# Patient Record
Sex: Female | Born: 1995 | Race: White | Hispanic: No | Marital: Single | Smoking: Never smoker
Health system: Southern US, Community
[De-identification: ages and names within clinical notes are randomized; demographics above are authoritative.]

## PROBLEM LIST (undated history)

## (undated) DIAGNOSIS — T8859XA Other complications of anesthesia, initial encounter: Secondary | ICD-10-CM

## (undated) DIAGNOSIS — S83249A Other tear of medial meniscus, current injury, unspecified knee, initial encounter: Secondary | ICD-10-CM

## (undated) DIAGNOSIS — J3089 Other allergic rhinitis: Secondary | ICD-10-CM

## (undated) DIAGNOSIS — T4145XA Adverse effect of unspecified anesthetic, initial encounter: Secondary | ICD-10-CM

## (undated) DIAGNOSIS — S83519A Sprain of anterior cruciate ligament of unspecified knee, initial encounter: Secondary | ICD-10-CM

## (undated) DIAGNOSIS — J3081 Allergic rhinitis due to animal (cat) (dog) hair and dander: Secondary | ICD-10-CM

## (undated) DIAGNOSIS — L709 Acne, unspecified: Secondary | ICD-10-CM

## (undated) HISTORY — PX: TONSILLECTOMY: SUR1361

## (undated) HISTORY — PX: ADENOIDECTOMY: SUR15

---
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
## 2016-05-25 ENCOUNTER — Encounter (HOSPITAL_BASED_OUTPATIENT_CLINIC_OR_DEPARTMENT_OTHER): Payer: Self-pay | Admitting: *Deleted

## 2016-06-01 DIAGNOSIS — T8489XA Other specified complication of internal orthopedic prosthetic devices, implants and grafts, initial encounter: Secondary | ICD-10-CM | POA: Diagnosis present

## 2016-06-01 NOTE — H&P (Signed)
Kerri Durham is an 20 y.o. female.   Chief Complaint: left knee pain and instablility [id number]:Kerri Durham is a 20 year old seen for follow-up evaluation from an injury to her left knee that occurred playing college basketball 2-1/2 months ago.  She had undergone an MRI that revealed a tear of her hamstring autograft with a medial meniscus tear.  She had previously undergone hamstring autograft ACL reconstruction by Dr. Dion SaucierLandau two years ago and had also undergone right knee hamstring autograft reconstruction four years ago by Dr. 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    Past Medical History:  Diagnosis Date  . ACL tear 03/2014   left  . Acne    takes Aldactone  . Allergy to cats   . Complication of anesthesia    residual numbness right thigh and shin area - "nerve block or tourniquet", per mother  . Dust allergy   . Medial meniscus tear 03/2014   left  . Tears of medial meniscus and ACL of left knee 03/23/2014    Past Surgical History:  Procedure Laterality Date  . KNEE ARTHROSCOPY WITH ANTERIOR CRUCIATE LIGAMENT (ACL) REPAIR Left 03/23/2014   Procedure: LEFT KNEE ARTHROSCOPY WITH MENISCUS DEBRIDMENT  MEDIAL, ANTERIOR CRUCIATE LIGAMENT (ACL) REPAIR;  Surgeon: Eulas PostJoshua P Landau, MD;  Location: [facility];  Service: Orthopedics;  Laterality: Left;  and meniscal debridment  . KNEE ARTHROSCOPY WITH ANTERIOR CRUCIATE LIGAMENT (ACL) REPAIR WITH HAMSTRING GRAFT  08/12/2012   Procedure: KNEE ARTHROSCOPY WITH ANTERIOR CRUCIATE LIGAMENT (ACL) REPAIR WITH HAMSTRING GRAFT;  Surgeon: Eulas PostJoshua P Landau, MD;  Location: [facility];  Service: Orthopedics;  Laterality: Right;  Partial Lateral Menisectomy also  . TONSILLECTOMY  age 89    Family History  Problem Relation Age of Onset   . Hypertension Maternal Grandmother   . Heart disease Maternal Grandmother   . Stroke Maternal Grandmother   . Hypertension Maternal Grandfather   . Diabetes Paternal Grandmother   . Hypertension Paternal Grandmother   . COPD Paternal Grandmother    Social History:  reports that she has never smoked. She has never used smokeless tobacco. She reports that she does not drink alcohol or use drugs.  Allergies: No Known Allergies  No current facility-administered medications for this encounter.   Current Outpatient Prescriptions:  .  zinc gluconate 50 MG tablet, Take 50 mg by mouth daily., Disp: , Rfl:  .  Ascorbic Acid (VITAMIN C) 1000 MG tablet, Take 1,000 mg by mouth daily., Disp: , Rfl:  .  calcium carbonate (OS-CAL) 600 MG TABS, Take 600 mg by mouth 2 (two) times daily with a meal., Disp: , Rfl:  .  cholecalciferol (VITAMIN D) 1000 UNITS tablet, Take 1,000 Units by mouth daily., Disp: , Rfl:  .  loratadine (CLARITIN) 10 MG tablet, Take 10 mg by mouth as needed. , Disp: , Rfl:  .  Multiple Vitamin (MULTIVITAMIN) tablet, Take 1 tablet by mouth daily., Disp: , Rfl:  .  vitamin B-12 (CYANOCOBALAMIN) 100 MCG tablet, Take 100 mcg by mouth daily., Disp: , Rfl:   No results found for this or any previous visit (from the past 48 hour(s)). No results found.  Review of Systems  Constitutional: Negative.   Eyes: Negative.   Cardiovascular: Negative.  Gastrointestinal: Negative.   Musculoskeletal: Positive for joint pain.  Skin: Negative.   Neurological: Negative.   Endo/Heme/Allergies: Negative.   Psychiatric/Behavioral: Negative.     Height 5\' 9"  (1.753 m), weight 71.7 kg (158 lb), last menstrual period 05/06/2016. Physical Exam  Constitutional: She is oriented to person, place, and time. She appears well-developed and well-nourished.  HENT:  Head: Normocephalic and atraumatic.  Eyes: Conjunctivae are normal. Pupils are equal, round, and reactive to light.  Cardiovascular: Normal  rate.   Respiratory: Effort normal.  GI: Soft.  Genitourinary:  Genitourinary Comments: Not pertinent to current symptomatology therefore not examined.  Musculoskeletal:  Left knee reveals previous incisions well healed.  No swelling, mild pain.  Full range of motion.  2-3+ Lachman.  Knee otherwise stable with positive medial McMurray's.  Examination of her right knee reveals previous well-healed ACL incisions.  No swelling, no pain.  Full range of motion.  Knee is stable with normal patellar tracking.  Vascular exam:  Pulses 2+ and symmetric.    Neurological: She is alert and oriented to person, place, and time.  Skin: Skin is warm and dry.  Psychiatric: She has a normal mood and affect. Her behavior is normal.     Assessment Principal Problem:   ACL graft tear (HCC) left knee Active Problems:   Tears of medial meniscus and ACL of left knee   Plan Left knee arthroscopy with partial medial meniscectomy and ACL revision using an allograft  Araf Clugston J, PA-C 06/01/2016, 4:43 PM

## 2016-06-02 ENCOUNTER — Encounter (HOSPITAL_BASED_OUTPATIENT_CLINIC_OR_DEPARTMENT_OTHER): Payer: Self-pay | Admitting: *Deleted

## 2016-06-05 ENCOUNTER — Encounter (HOSPITAL_BASED_OUTPATIENT_CLINIC_OR_DEPARTMENT_OTHER): Payer: Self-pay | Admitting: *Deleted

## 2016-06-05 ENCOUNTER — Ambulatory Visit (HOSPITAL_BASED_OUTPATIENT_CLINIC_OR_DEPARTMENT_OTHER): Payer: BLUE CROSS/BLUE SHIELD | Admitting: Anesthesiology

## 2016-06-05 ENCOUNTER — Encounter (HOSPITAL_BASED_OUTPATIENT_CLINIC_OR_DEPARTMENT_OTHER)
Admission: RE | Disposition: A | Discharge status: Home / Self Care | Payer: Self-pay | Source: Ambulatory Visit | Attending: Orthopedic Surgery

## 2016-06-05 ENCOUNTER — Ambulatory Visit (HOSPITAL_BASED_OUTPATIENT_CLINIC_OR_DEPARTMENT_OTHER)
Admission: RE | Admit: 2016-06-05 | Discharge: 2016-06-05 | Disposition: A | Discharge status: Home / Self Care | Payer: BLUE CROSS/BLUE SHIELD | Source: Ambulatory Visit | Attending: Orthopedic Surgery | Admitting: Orthopedic Surgery

## 2016-06-05 DIAGNOSIS — Y832 Surgical operation with anastomosis, bypass or graft as the cause of abnormal reaction of the patient, or of later complication, without mention of misadventure at the time of the procedure: Secondary | ICD-10-CM | POA: Diagnosis not present

## 2016-06-05 DIAGNOSIS — S83511A Sprain of anterior cruciate ligament of right knee, initial encounter: Secondary | ICD-10-CM | POA: Diagnosis present

## 2016-06-05 DIAGNOSIS — T85898A Other specified complication of other internal prosthetic devices, implants and grafts, initial encounter: Secondary | ICD-10-CM | POA: Diagnosis not present

## 2016-06-05 DIAGNOSIS — S83271A Complex tear of lateral meniscus, current injury, right knee, initial encounter: Secondary | ICD-10-CM | POA: Diagnosis present

## 2016-06-05 DIAGNOSIS — X58XXXA Exposure to other specified factors, initial encounter: Secondary | ICD-10-CM | POA: Diagnosis not present

## 2016-06-05 DIAGNOSIS — S83282A Other tear of lateral meniscus, current injury, left knee, initial encounter: Secondary | ICD-10-CM | POA: Diagnosis present

## 2016-06-05 DIAGNOSIS — M94262 Chondromalacia, left knee: Secondary | ICD-10-CM | POA: Diagnosis not present

## 2016-06-05 DIAGNOSIS — T8489XA Other specified complication of internal orthopedic prosthetic devices, implants and grafts, initial encounter: Secondary | ICD-10-CM | POA: Diagnosis present

## 2016-06-05 DIAGNOSIS — S83512A Sprain of anterior cruciate ligament of left knee, initial encounter: Secondary | ICD-10-CM

## 2016-06-05 DIAGNOSIS — S83207A Unspecified tear of unspecified meniscus, current injury, left knee, initial encounter: Secondary | ICD-10-CM | POA: Diagnosis present

## 2016-06-05 HISTORY — PX: KNEE ARTHROSCOPY WITH LATERAL MENISECTOMY: SHX6193

## 2016-06-05 SURGERY — ARTHROSCOPY, KNEE, WITH LATERAL MENISCECTOMY
Anesthesia: Regional | Site: Knee | Laterality: Left

## 2016-06-05 MED ORDER — MIDAZOLAM HCL 2 MG/2ML IJ SOLN
INTRAMUSCULAR | Status: AC
  Filled 2016-06-05: qty 2

## 2016-06-05 MED ORDER — MIDAZOLAM HCL 2 MG/2ML IJ SOLN
1.0000 mg | INTRAMUSCULAR | Status: DC | PRN
  Administered 2016-06-05: 1 mg via INTRAVENOUS
  Administered 2016-06-05: 2 mg via INTRAVENOUS

## 2016-06-05 MED ORDER — HYDROMORPHONE HCL 1 MG/ML IJ SOLN: 0.2500 mg | INTRAMUSCULAR | Status: DC | PRN

## 2016-06-05 MED ORDER — BUPIVACAINE-EPINEPHRINE (PF) 0.5% -1:200000 IJ SOLN
INTRAMUSCULAR | Status: DC | PRN
  Administered 2016-06-05: 30 mL via PERINEURAL

## 2016-06-05 MED ORDER — HYDROCODONE-ACETAMINOPHEN 5-325 MG PO TABS: ORAL_TABLET | ORAL | 0 refills | Status: DC

## 2016-06-05 MED ORDER — LACTATED RINGERS IV SOLN
INTRAVENOUS | Status: DC
  Administered 2016-06-05 (×2): via INTRAVENOUS

## 2016-06-05 MED ORDER — FENTANYL CITRATE (PF) 100 MCG/2ML IJ SOLN
50.0000 ug | INTRAMUSCULAR | Status: AC | PRN
  Administered 2016-06-05: 50 ug via INTRAVENOUS
  Administered 2016-06-05: 100 ug via INTRAVENOUS
  Administered 2016-06-05: 50 ug via INTRAVENOUS

## 2016-06-05 MED ORDER — CHLORHEXIDINE GLUCONATE 4 % EX LIQD: 60.0000 mL | Freq: Once | CUTANEOUS | Status: DC

## 2016-06-05 MED ORDER — BUPIVACAINE-EPINEPHRINE 0.25% -1:200000 IJ SOLN
INTRAMUSCULAR | Status: DC | PRN
  Administered 2016-06-05: 20 mL

## 2016-06-05 MED ORDER — LIDOCAINE 2% (20 MG/ML) 5 ML SYRINGE
INTRAMUSCULAR | Status: AC
  Filled 2016-06-05: qty 5

## 2016-06-05 MED ORDER — PROPOFOL 10 MG/ML IV BOLUS
INTRAVENOUS | Status: DC | PRN
  Administered 2016-06-05: 150 mg via INTRAVENOUS

## 2016-06-05 MED ORDER — ONDANSETRON HCL 4 MG/2ML IJ SOLN
INTRAMUSCULAR | Status: AC
  Filled 2016-06-05: qty 2

## 2016-06-05 MED ORDER — FENTANYL CITRATE (PF) 100 MCG/2ML IJ SOLN
INTRAMUSCULAR | Status: AC
  Filled 2016-06-05: qty 2

## 2016-06-05 MED ORDER — POVIDONE-IODINE 7.5 % EX SOLN: Freq: Once | CUTANEOUS | Status: DC

## 2016-06-05 MED ORDER — ONDANSETRON HCL 4 MG/2ML IJ SOLN
INTRAMUSCULAR | Status: DC | PRN
  Administered 2016-06-05: 4 mg via INTRAVENOUS

## 2016-06-05 MED ORDER — ACETAMINOPHEN 500 MG PO TABS
ORAL_TABLET | ORAL | Status: AC
  Filled 2016-06-05: qty 2

## 2016-06-05 MED ORDER — BUPIVACAINE-EPINEPHRINE (PF) 0.25% -1:200000 IJ SOLN
INTRAMUSCULAR | Status: AC
  Filled 2016-06-05: qty 30

## 2016-06-05 MED ORDER — CEFAZOLIN SODIUM-DEXTROSE 2-4 GM/100ML-% IV SOLN
INTRAVENOUS | Status: AC
  Filled 2016-06-05: qty 100

## 2016-06-05 MED ORDER — GLYCOPYRROLATE 0.2 MG/ML IJ SOLN: 0.2000 mg | Freq: Once | INTRAMUSCULAR | Status: DC | PRN

## 2016-06-05 MED ORDER — SCOPOLAMINE 1 MG/3DAYS TD PT72: 1.0000 | MEDICATED_PATCH | Freq: Once | TRANSDERMAL | Status: DC | PRN

## 2016-06-05 MED ORDER — PROPOFOL 500 MG/50ML IV EMUL
INTRAVENOUS | Status: AC
  Filled 2016-06-05: qty 50

## 2016-06-05 MED ORDER — ACETAMINOPHEN 160 MG/5ML PO SOLN: 960.0000 mg | Freq: Once | ORAL | Status: AC

## 2016-06-05 MED ORDER — LIDOCAINE 2% (20 MG/ML) 5 ML SYRINGE
INTRAMUSCULAR | Status: DC | PRN
  Administered 2016-06-05: 40 mg via INTRAVENOUS

## 2016-06-05 MED ORDER — DEXAMETHASONE SODIUM PHOSPHATE 10 MG/ML IJ SOLN
INTRAMUSCULAR | Status: DC | PRN
  Administered 2016-06-05: 10 mg via INTRAVENOUS

## 2016-06-05 MED ORDER — ATROPINE SULFATE 0.4 MG/ML IJ SOLN
INTRAMUSCULAR | Status: AC
  Filled 2016-06-05: qty 1

## 2016-06-05 MED ORDER — DEXAMETHASONE SODIUM PHOSPHATE 10 MG/ML IJ SOLN
INTRAMUSCULAR | Status: AC
Start: 2016-06-05 — End: 2016-06-05
  Filled 2016-06-05: qty 1

## 2016-06-05 MED ORDER — EPINEPHRINE HCL 1 MG/ML IJ SOLN
INTRAMUSCULAR | Status: DC | PRN
  Administered 2016-06-05: 2000 mL

## 2016-06-05 MED ORDER — EPINEPHRINE HCL 1 MG/ML IJ SOLN
INTRAMUSCULAR | Status: AC
Start: 2016-06-05 — End: 2016-06-05
  Filled 2016-06-05: qty 1

## 2016-06-05 MED ORDER — ACETAMINOPHEN 500 MG PO TABS
1000.0000 mg | ORAL_TABLET | Freq: Once | ORAL | Status: AC
  Administered 2016-06-05: 1000 mg via ORAL

## 2016-06-05 MED ORDER — CEFAZOLIN SODIUM-DEXTROSE 2-4 GM/100ML-% IV SOLN
2.0000 g | INTRAVENOUS | Status: AC
  Administered 2016-06-05: 2 g via INTRAVENOUS

## 2016-06-05 MED ORDER — LACTATED RINGERS IV SOLN: INTRAVENOUS | Status: DC

## 2016-06-05 SURGICAL SUPPLY — 95 items
APL SKNCLS STERI-STRIP NONHPOA (GAUZE/BANDAGES/DRESSINGS) ×2
BANDAGE ACE 4X5 VEL STRL LF (GAUZE/BANDAGES/DRESSINGS) ×4 IMPLANT
BENZOIN TINCTURE PRP APPL 2/3 (GAUZE/BANDAGES/DRESSINGS) ×4 IMPLANT
BLADE CUDA GRT WHITE 3.5 (BLADE) ×4 IMPLANT
BLADE CUTTER GATOR 3.5 (BLADE) ×4 IMPLANT
BLADE HEX COATED 2.75 (ELECTRODE) ×3 IMPLANT
BLADE SURG 15 STRL LF DISP TIS (BLADE) ×2 IMPLANT
BLADE SURG 15 STRL SS (BLADE) ×4
BUR OVAL 4.0 (BURR) ×3 IMPLANT
BUR OVAL 6.0 (BURR) ×4 IMPLANT
CLOSURE WOUND 1/2 X4 (GAUZE/BANDAGES/DRESSINGS) ×1
COVER BACK TABLE 60X90IN (DRAPES) ×4 IMPLANT
CUFF TOURNIQUET SINGLE 34IN LL (TOURNIQUET CUFF) ×3 IMPLANT
CUTTER FLIP II 9.5MM (INSTRUMENTS) IMPLANT
DECANTER SPIKE VIAL GLASS SM (MISCELLANEOUS) IMPLANT
DRAPE ARTHROSCOPY W/POUCH 90 (DRAPES) ×4 IMPLANT
DRAPE OEC MINIVIEW 54X84 (DRAPES) ×4 IMPLANT
DRAPE U-SHAPE 47X51 STRL (DRAPES) ×4 IMPLANT
DRAPE U-SHAPE 76X120 STRL (DRAPES) ×4 IMPLANT
DRILL FLIPCUTTER II 10.5MM (CUTTER) IMPLANT
DRILL FLIPCUTTER II 10MM (CUTTER) IMPLANT
DRILL FLIPCUTTER II 7.0MM (INSTRUMENTS) IMPLANT
DRILL FLIPCUTTER II 7.5MM (MISCELLANEOUS) IMPLANT
DRILL FLIPCUTTER II 8.0MM (INSTRUMENTS) IMPLANT
DRILL FLIPCUTTER II 8.5MM (INSTRUMENTS) IMPLANT
DRILL FLIPCUTTER II 9.0MM (INSTRUMENTS) IMPLANT
DRSG PAD ABDOMINAL 8X10 ST (GAUZE/BANDAGES/DRESSINGS) IMPLANT
DURAPREP 26ML APPLICATOR (WOUND CARE) ×4 IMPLANT
ELECT REM PT RETURN 9FT ADLT (ELECTROSURGICAL) ×4
ELECTRODE REM PT RTRN 9FT ADLT (ELECTROSURGICAL) ×2 IMPLANT
FLIP CUTTER II 7.0MM (INSTRUMENTS)
FLIPCUTTER II 10.5MM (CUTTER)
FLIPCUTTER II 10MM (CUTTER)
FLIPCUTTER II 7.5MM (MISCELLANEOUS)
FLIPCUTTER II 8.0MM (INSTRUMENTS)
FLIPCUTTER II 8.5MM (INSTRUMENTS)
FLIPCUTTER II 9.0MM (INSTRUMENTS)
GAUZE SPONGE 4X4 12PLY STRL (GAUZE/BANDAGES/DRESSINGS) ×4 IMPLANT
GAUZE XEROFORM 1X8 LF (GAUZE/BANDAGES/DRESSINGS) ×4 IMPLANT
GLOVE BIO SURGEON STRL SZ7 (GLOVE) ×8 IMPLANT
GLOVE BIOGEL M STRL SZ7.5 (GLOVE) ×3 IMPLANT
GLOVE BIOGEL PI IND STRL 7.0 (GLOVE) ×4 IMPLANT
GLOVE BIOGEL PI IND STRL 7.5 (GLOVE) ×2 IMPLANT
GLOVE BIOGEL PI IND STRL 8 (GLOVE) ×1 IMPLANT
GLOVE BIOGEL PI INDICATOR 7.0 (GLOVE) ×4
GLOVE BIOGEL PI INDICATOR 7.5 (GLOVE) ×2
GLOVE BIOGEL PI INDICATOR 8 (GLOVE) ×2
GLOVE SS BIOGEL STRL SZ 7.5 (GLOVE) ×2 IMPLANT
GLOVE SUPERSENSE BIOGEL SZ 7.5 (GLOVE) ×2
GOWN STRL REUS W/ TWL LRG LVL3 (GOWN DISPOSABLE) ×5 IMPLANT
GOWN STRL REUS W/ TWL XL LVL3 (GOWN DISPOSABLE) ×1 IMPLANT
GOWN STRL REUS W/TWL LRG LVL3 (GOWN DISPOSABLE) ×8
GOWN STRL REUS W/TWL XL LVL3 (GOWN DISPOSABLE) ×4
GUIDEPIN REAMER CUTTER 11MM (INSTRUMENTS) IMPLANT
IMMOBILIZER KNEE 22 UNIV (SOFTGOODS) IMPLANT
IMMOBILIZER KNEE 24 THIGH 36 (MISCELLANEOUS) IMPLANT
IMMOBILIZER KNEE 24 UNIV (MISCELLANEOUS)
IV NS IRRIG 3000ML ARTHROMATIC (IV SOLUTION) ×6 IMPLANT
KNEE WRAP E Z 3 GEL PACK (MISCELLANEOUS) ×4 IMPLANT
LOOP 2 FIBERLINK CLOSED (SUTURE) IMPLANT
MANIFOLD NEPTUNE II (INSTRUMENTS) ×4 IMPLANT
MARKER SKIN DUAL TIP RULER LAB (MISCELLANEOUS) ×1 IMPLANT
NDL SAFETY ECLIPSE 18X1.5 (NEEDLE) ×4 IMPLANT
NEEDLE HYPO 18GX1.5 SHARP (NEEDLE) ×8
NEEDLE HYPO 22GX1.5 SAFETY (NEEDLE) ×3 IMPLANT
PACK ARTHROSCOPY DSU (CUSTOM PROCEDURE TRAY) ×4 IMPLANT
PACK BASIN DAY SURGERY FS (CUSTOM PROCEDURE TRAY) ×4 IMPLANT
PAD CAST 4YDX4 CTTN HI CHSV (CAST SUPPLIES) IMPLANT
PADDING CAST COTTON 4X4 STRL (CAST SUPPLIES)
PENCIL BUTTON HOLSTER BLD 10FT (ELECTRODE) ×3 IMPLANT
PIN DRILL ACL TIGHTROPE 4MM (PIN) IMPLANT
SET ARTHROSCOPY TUBING (MISCELLANEOUS) ×4
SET ARTHROSCOPY TUBING LN (MISCELLANEOUS) ×2 IMPLANT
SLEEVE SCD COMPRESS KNEE MED (MISCELLANEOUS) ×4 IMPLANT
SPONGE LAP 4X18 X RAY DECT (DISPOSABLE) ×4 IMPLANT
STOCKING TED THIGH LEN LRG REG (STOCKING)
STOCKING TED THIGH LEN MED REG (STOCKING)
STOCKING THIGH LG REG (STOCKING) IMPLANT
STOCKING THIGH MED REG (STOCKING) IMPLANT
STRIP CLOSURE SKIN 1/2X4 (GAUZE/BANDAGES/DRESSINGS) ×3 IMPLANT
SUCTION FRAZIER HANDLE 10FR (MISCELLANEOUS) ×2
SUCTION TUBE FRAZIER 10FR DISP (MISCELLANEOUS) ×2 IMPLANT
SUT 2 FIBERLOOP 20 STRT BLUE (SUTURE) ×4
SUT ETHILON 4 0 PS 2 18 (SUTURE) IMPLANT
SUT FIBERWIRE #2 38 T-5 BLUE (SUTURE)
SUT PROLENE 3 0 PS 2 (SUTURE) ×4 IMPLANT
SUT VIC AB 0 CT3 27 (SUTURE) ×5 IMPLANT
SUT VIC AB 3-0 SH 27 (SUTURE) ×4
SUT VIC AB 3-0 SH 27X BRD (SUTURE) ×2 IMPLANT
SUTURE 2 FIBERLOOP 20 STRT BLU (SUTURE) ×1 IMPLANT
SUTURE FIBERWR #2 38 T-5 BLUE (SUTURE) IMPLANT
SYR 20CC LL (SYRINGE) IMPLANT
SYR 5ML LL (SYRINGE) ×4 IMPLANT
WAND STAR VAC 90 (SURGICAL WAND) IMPLANT
WATER STERILE IRR 1000ML POUR (IV SOLUTION) ×4 IMPLANT

## 2016-06-05 NOTE — Discharge Instructions (Signed)
Knee arthroscopy Care After Refer to this sheet in the next few weeks. These discharge instructions provide you with general information on caring for yourself after you leave the hospital. Your caregiver may also give you specific instructions. Your treatment has been planned according to the most current medical practices available, but unavoidable complications sometimes occur. If you have any problems or questions after discharge, please call your caregiver. HOME INSTRUCTIONS You may resume a normal diet and activities as directed. Perform exercises as directed.  Take showers instead of baths until informed otherwise.  Change bandages (dressings) in 3 days.  Swab wounds daily with betadine.  Wash leg with soap and water.  Pat dry.  Cover wounds with bandaids. Only take over-the-counter or prescription medicines for pain, discomfort, or fever as directed by your caregiver.  Eat a well-balanced diet.  Avoid lifting or driving until you are instructed otherwise.  Make an appointment to see your caregiver for stitches (suture) or staple removal as directed.    Post Anesthesia Home Care Instructions  Activity: Get plenty of rest for the remainder of the day. A responsible adult should stay with you for 24 hours following the procedure.  For the next 24 hours, DO NOT: -Drive a car -Advertising copywriterperate machinery -Drink alcoholic beverages -Take any medication unless instructed by your physician -Make any legal decisions or sign important papers.  Meals: Start with liquid foods such as gelatin or soup. Progress to regular foods as tolerated. Avoid greasy, spicy, heavy foods. If nausea and/or vomiting occur, drink only clear liquids until the nausea and/or vomiting subsides. Call your physician if vomiting continues.  Special Instructions/Symptoms: Your throat may feel dry or sore from the anesthesia or the breathing tube placed in your throat during surgery. If this causes discomfort, gargle with warm  salt water. The discomfort should disappear within 24 hours.  If you had a scopolamine patch placed behind your ear for the management of post- operative nausea and/or vomiting:  1. The medication in the patch is effective for 72 hours, after which it should be removed.  Wrap patch in a tissue and discard in the trash. Wash hands thoroughly with soap and water. 2. You may remove the patch earlier than 72 hours if you experience unpleasant side effects which may include dry mouth, dizziness or visual disturbances. 3. Avoid touching the patch. Wash your hands with soap and water after contact with the patch.   Regional Anesthesia Blocks  1. Numbness or the inability to move the "blocked" extremity may last from 3-48 hours after placement. The length of time depends on the medication injected and your individual response to the medication. If the numbness is not going away after 48 hours, call your surgeon.  2. The extremity that is blocked will need to be protected until the numbness is gone and the  Strength has returned. Because you cannot feel it, you will need to take extra care to avoid injury. Because it may be weak, you may have difficulty moving it or using it. You may not know what position it is in without looking at it while the block is in effect.  3. For blocks in the legs and feet, returning to weight bearing and walking needs to be done carefully. You will need to wait until the numbness is entirely gone and the strength has returned. You should be able to move your leg and foot normally before you try and bear weight or walk. You will need someone to be with  you when you first try to ensure you do not fall and possibly risk injury.  4. Bruising and tenderness at the needle site are common side effects and will resolve in a few days.  5. Persistent numbness or new problems with movement should be communicated to the surgeon or the [facility] [phone] [facility] [phone]).  SEEK MEDICAL CARE IF: You have swelling of your calf or leg.  You develop shortness of breath or chest pain.  You have redness, swelling, or increasing pain in the wound.  There is pus or any unusual drainage coming from the surgical site.  You notice a bad smell coming from the surgical site or dressing.  The surgical site breaks open after sutures or staples have been removed.  There is persistent bleeding from the suture or staple line.  You are getting worse or are not improving.  You have any other questions or concerns.  SEEK IMMEDIATE MEDICAL CARE IF:  You have a fever.  You develop a rash.  You have difficulty breathing.  You develop any reaction or side effects to medicines given.  Your knee motion is decreasing rather than improving.  MAKE SURE YOU:  Understand these instructions.  Will watch your condition.  Will get help right away if you are not doing well or get worse.

## 2016-06-05 NOTE — Anesthesia Procedure Notes (Addendum)
Anesthesia Regional Block:  Adductor canal block  Pre-Anesthetic Checklist: ,, timeout performed, Correct Patient, Correct Site, Correct Laterality, Correct Procedure, Correct Position, site marked, Risks and benefits discussed, pre-op evaluation,  At surgeon's request and post-op pain management  Laterality: Left  Prep: Maximum Sterile Barrier Precautions used, chloraprep       Needles:  Injection technique: Single-shot  Needle Type: Echogenic Stimulator Needle     Needle Length: 9cm 9 cm Needle Gauge: 21 and 21 G    Additional Needles:  Procedures: ultrasound guided (picture in chart) Adductor canal block Narrative:  Start time: 06/05/2016 7:12 AM End time: 06/05/2016 7:22 AM Injection made incrementally with aspirations every 5 mL. Anesthesiologist: Gaynelle AduFITZGERALD, Farrie Sann  Additional Notes: 2% Lidocaine skin wheel.

## 2016-06-05 NOTE — Anesthesia Postprocedure Evaluation (Signed)
Anesthesia Post Note  Patient: Chia A Veiga  Procedure(s) Performed: Procedure(s) (LRB): Left knee arthroscopy with partial medial meniscectomy (Left)  Patient location during evaluation: PACU Anesthesia Type: General and Regional Level of consciousness: awake and alert Pain management: pain level controlled Vital Signs Assessment: post-procedure vital signs reviewed and stable Respiratory status: spontaneous breathing, nonlabored ventilation and respiratory function stable Cardiovascular status: blood pressure returned to baseline and stable Postop Assessment: no signs of nausea or vomiting Anesthetic complications: no    Last Vitals:  Vitals:   06/05/16 0945 06/05/16 1000  BP: 102/66 101/65  Pulse: (!) 47 (!) 48  Resp: 11 12  Temp:      Last Pain:  Vitals:   06/05/16 0703  TempSrc: Oral                 Nerissa Constantin,W. EDMOND

## 2016-06-05 NOTE — Anesthesia Procedure Notes (Signed)
Procedure Name: LMA Insertion Date/Time: 06/05/2016 8:04 AM Performed by: Curly ShoresRAFT, Haeley Fordham W Pre-anesthesia Checklist: Patient identified, Emergency Drugs available, Suction available and Patient being monitored Patient Re-evaluated:Patient Re-evaluated prior to inductionOxygen Delivery Method: Circle system utilized Preoxygenation: Pre-oxygenation with 100% oxygen Intubation Type: IV induction Ventilation: Mask ventilation without difficulty LMA: LMA inserted LMA Size: 4.0 Number of attempts: 1 Airway Equipment and Method: Bite block Placement Confirmation: positive ETCO2 and breath sounds checked- equal and bilateral Tube secured with: Tape Dental Injury: Teeth and Oropharynx as per pre-operative assessment

## 2016-06-05 NOTE — Interval H&P Note (Signed)
History and Physical Interval Note:  06/05/2016 7:51 AM  Kerri Durham  has presented today for surgery, with the diagnosis of LEFT KNEE ARTHROSCOPY SPONTANEOUS DISRUPTION OF ANTERIOR CRUCIATE LIGAMENT,TEAR OF MEDIAL MENISCUS  The various methods of treatment have been discussed with the patient and family. After consideration of risks, benefits and other options for treatment, the patient has consented to  Procedure(s): LEFT KNEE ARTHROSCOPY MEDIAL MENISCECTOMY,ANTERIOR CRUCIATE LIGAMENT HAMSTRING ALLOGRAFT (Left) as a surgical intervention .  The patient's history has been reviewed, patient examined, no change in status, stable for surgery.  I have reviewed the patient's chart and labs.  Questions were answered to the patient's satisfaction.     Salvatore MarvelWAINER,Joseantonio Dittmar A

## 2016-06-05 NOTE — Anesthesia Preprocedure Evaluation (Addendum)
Anesthesia Evaluation  Patient identified by MRN, date of birth, ID band Patient awake    Reviewed: Allergy & Precautions, H&P , NPO status , Patient's Chart, lab work & pertinent test results  Airway Mallampati: I  TM Distance: >3 FB Neck ROM: Full    Dental no notable dental hx. (+) Teeth Intact, Dental Advisory Given   Pulmonary neg pulmonary ROS,    Pulmonary exam normal breath sounds clear to auscultation       Cardiovascular negative cardio ROS   Rhythm:Regular Rate:Normal     Neuro/Psych negative neurological ROS  negative psych ROS   GI/Hepatic negative GI ROS, Neg liver ROS,   Endo/Other  negative endocrine ROS  Renal/GU negative Renal ROS  negative genitourinary   Musculoskeletal   Abdominal   Peds  Hematology negative hematology ROS (+)   Anesthesia Other Findings   Reproductive/Obstetrics negative OB ROS                            Anesthesia Physical Anesthesia Plan  ASA: I  Anesthesia Plan: General and Regional   Post-op Pain Management: GA combined w/ Regional for post-op pain   Induction: Intravenous  Airway Management Planned: LMA  Additional Equipment:   Intra-op Plan:   Post-operative Plan: Extubation in OR  Informed Consent: I have reviewed the patients History and Physical, chart, labs and discussed the procedure including the risks, benefits and alternatives for the proposed anesthesia with the patient or authorized representative who has indicated his/her understanding and acceptance.   Dental advisory given  Plan Discussed with: CRNA  Anesthesia Plan Comments:         Anesthesia Quick Evaluation  

## 2016-06-05 NOTE — Transfer of Care (Signed)
Immediate Anesthesia Transfer of Care Note  Patient: Kerri Durham  Procedure(s) Performed: Procedure(s): Left knee arthroscopy with partial medial meniscectomy (Left)  Patient Location: PACU  Anesthesia Type:GA combined with regional for post-op pain  Level of Consciousness: awake, sedated and responds to stimulation  Airway & Oxygen Therapy: Patient Spontanous Breathing and Patient connected to face mask oxygen  Post-op Assessment: Report given to RN, Post -op Vital signs reviewed and stable and Patient moving all extremities  Post vital signs: Reviewed and stable  Last Vitals:  Vitals:   06/05/16 0715 06/05/16 0849  BP: 106/66 (!) 94/54  Pulse:  (!) 52  Resp: 17 10  Temp:      Last Pain:  Vitals:   06/05/16 0703  TempSrc: Oral         Complications: No apparent anesthesia complications

## 2016-06-05 NOTE — Progress Notes (Signed)
Assisted Dr. Edmond Fitzgerald with left, ultrasound guided, adductor canal block. Side rails up, monitors on throughout procedure. See vital signs in flow sheet. Tolerated Procedure well. 

## 2016-06-08 ENCOUNTER — Encounter (HOSPITAL_BASED_OUTPATIENT_CLINIC_OR_DEPARTMENT_OTHER): Payer: Self-pay | Admitting: Orthopedic Surgery

## 2016-06-08 NOTE — Op Note (Signed)
Kerri Durham, Kerri Durham              ACCOUNT NO.:  [id number]  MEDICAL RECORD NO.:  [id number]  LOCATION:                                 FACILITY:  PHYSICIAN:  Lorenz Donley A. Thurston Hole, M.D.      DATE OF BIRTH:  DATE OF PROCEDURE:  06/05/2016 DATE OF DISCHARGE:                              OPERATIVE REPORT   PREOPERATIVE DIAGNOSES: 1. Left knee anterior cruciate ligament graft, partial versus complete     tear, acute-traumatic. 2. Left knee acute traumatic lateral meniscus tear.  POSTOPERATIVE DIAGNOSES: 1. Left knee grade 1/2 anterior cruciate ligament graft sprain. 2. Left knee acute traumatic lateral meniscus tear.  PROCEDURE:  Left knee EUA followed by arthroscopic partial lateral meniscectomy.  SURGEON:  Elana Alm. Thurston Hole, M.D.  ASSISTANT:  Kirstin Shepperson, PA-C.  ANESTHESIA:  General.  OPERATIVE TIME:  45 minutes.  COMPLICATIONS:  None.  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  Exam and MRI have revealed a left knee ACL graft injury with a partial versus complete tear with possible meniscal tear.  She has failed conservative care and is now to undergo arthroscopy with possible ACL revision, reconstruction, and attention to her meniscal pathology.  DESCRIPTION:  Kerri Durham was brought to the operating room on 06/05/2016 after an adductor canal block had been placed in the holding room by Anesthesia.  She was placed on the operative table in supine position. She received antibiotics preoperatively for prophylaxis.  After being placed under general anesthesia, her left knee was examined.  She had full range of motion, 1 to 2+ Lachman, negative pivot shift.  She did have a good end point of knee, otherwise stable ligamentous exam with normal patellar tracking.  The knee was sterilely injected with 0.25% Marcaine with epinephrine.  Left leg was prepped using  sterile DuraPrep and draped using sterile technique.  Time-out procedure was called and the correct left knee identified.  Initially through an anterolateral portal, the arthroscope with a pump attached was placed and through an anteromedial portal, an arthroscopic probe was placed.  On initial inspection, the medial compartment, the articular cartilage was normal. Medial meniscus of which she had a previous partial meniscectomy of which 50% of the meniscus remained was intact.  No new tears were seen. Intercondylar notch inspected.  Her anterior cruciate ligament graft appeared to be intact.  There were small partial tears of 10% which were debrided, but there was only 2-3 mm of anterior laxity with a firm endpoint and thus I did not feel a revision reconstruction was indicated.  There was a spur in the notch which was removed which was impinging in full extension.  PCL was intact and stable.  Lateral compartment inspected.  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  Lateral femoral condyle was intact. Lateral meniscus showed a small partial tear 10%-15% posterolateral corner, which was resected back to a stable rim.  Patellofemoral joint articular cartilage was normal.  The patella tracked normally.  Medial gutters were free of pathology.  After this done, it was felt that all  pathology had been satisfactorily addressed.  The instruments removed. Portals closed with 3-0 nylon suture, sterile dressings were applied. The patient awakened and taken to recovery room in stable condition.  FOLLOWUP CARE:  Kerri Durham be followed as an outpatient, on Norco for pain. She will be seen back in the office in a week for sutures out and followup.     Kinsey Cowsert A. Thurston HoleWainer, M.D.   ______________________________ Elana Almobert A. Thurston HoleWainer, M.D.    RAW/MEDQ  D:  06/05/2016  T:  06/06/2016  Job:  [id number]

## 2016-10-07 ENCOUNTER — Encounter (INDEPENDENT_AMBULATORY_CARE_PROVIDER_SITE_OTHER): Payer: Self-pay

## 2016-10-07 ENCOUNTER — Ambulatory Visit (INDEPENDENT_AMBULATORY_CARE_PROVIDER_SITE_OTHER): Payer: BLUE CROSS/BLUE SHIELD | Admitting: Allergy and Immunology

## 2016-10-07 ENCOUNTER — Encounter: Payer: Self-pay | Admitting: Allergy and Immunology

## 2016-10-07 VITALS — BP 98/68 | HR 61 | Temp 98.2°F | Resp 18 | Ht 68.75 in | Wt 157.2 lb

## 2016-10-07 DIAGNOSIS — J3089 Other allergic rhinitis: Secondary | ICD-10-CM

## 2016-10-07 MED ORDER — MONTELUKAST SODIUM 10 MG PO TABS: 10.0000 mg | ORAL_TABLET | Freq: Every day | ORAL | 5 refills | Status: AC

## 2016-10-07 MED ORDER — AUVI-Q 0.3 MG/0.3ML IJ SOAJ: INTRAMUSCULAR | 1 refills | Status: AC

## 2016-10-07 NOTE — Progress Notes (Signed)
Follow-up Note  Referring Provider: No ref. provider found Primary Provider: Herb GraysSPEAR, TAMMY, MD (Inactive) Date of Office Visit: 10/07/2016  Subjective:   Kerri Durham (DOB: 06/27/1996) is a 20 y.o. female who [facility] in re-evaluation of the following:  HPI: Kerri Durham returns to this clinic in reevaluation of her allergic rhinitis. I've not seen her in tis clinic since February 2015. Skin testing obtained 2011 demonstrated severe hypersensitivity directed against house dust mite and cat and to some degree dog.  Over the course of the past few years she's had difficulty with dust exposure. She has chronic nasal congestion and sneezing and intermittent anosmia and occasional eye itching without any ugly nasal discharge. Recently she went to visit a Aunt who had a very dusty house and she developed very severe inflammation and congestion of her nose. She recently saw Dr. Haroldine Lawsrossley for evaluation of a parotid gland abnormality identified on an MRI ordered by her neurologist in evaluation of headache. Doctor Haroldine Lawsrossley felt she had "sinusitis" and put her on antibiotics which has not really helped her.  Allergies as of 10/07/2016      Reactions   Neomycin-bacitracin-polymyxin  [bacitracin-neomycin-polymyxin] Hives   Neosporin [neomycin-bacitracin Zn-polymyx] Hives      Medication List    none      Past Medical History:  Diagnosis Date  . ACL tear 05/2016   left  . Acne    takes Aldactone  . Allergy to cats   . Complication of anesthesia    residual numbness right thigh and shin area - "nerve block or tourniquet", per mother  . Dust allergy   . Medial meniscus tear 05/2016   left    Past Surgical History:  Procedure Laterality Date  . ADENOIDECTOMY    . KNEE ARTHROSCOPY WITH ANTERIOR CRUCIATE LIGAMENT (ACL) REPAIR Left 03/23/2014   Procedure: LEFT KNEE ARTHROSCOPY WITH MENISCUS DEBRIDMENT  MEDIAL, ANTERIOR CRUCIATE LIGAMENT (ACL)  REPAIR;  Surgeon: Eulas PostJoshua P Landau, MD;  Location: [facility];  Service: Orthopedics;  Laterality: Left;  and meniscal debridment  . KNEE ARTHROSCOPY WITH ANTERIOR CRUCIATE LIGAMENT (ACL) REPAIR WITH HAMSTRING GRAFT  08/12/2012   Procedure: KNEE ARTHROSCOPY WITH ANTERIOR CRUCIATE LIGAMENT (ACL) REPAIR WITH HAMSTRING GRAFT;  Surgeon: Eulas PostJoshua P Landau, MD;  Location: [facility];  Service: Orthopedics;  Laterality: Right;  Partial Lateral Menisectomy also  . KNEE ARTHROSCOPY WITH LATERAL MENISECTOMY Left 06/05/2016   Procedure: Left knee arthroscopy with partial medial meniscectomy;  Surgeon: Salvatore Marvelobert Wainer, MD;  Location: [facility];  Service: Orthopedics;  Laterality: Left;  . TONSILLECTOMY  age 88    Review of systems negative except as noted in HPI / PMHx or noted below:  Review of Systems  Constitutional: Negative.   HENT: Negative.   Eyes: Negative.   Respiratory: Negative.   Cardiovascular: Negative.   Gastrointestinal: Negative.   Genitourinary: Negative.   Musculoskeletal: Negative.   Skin: Negative.   Neurological: Negative.   Endo/Heme/Allergies: Negative.   Psychiatric/Behavioral: Negative.      Objective:   Vitals:   10/07/16 0920  BP: 98/68  Pulse: 61  Resp: 18  Temp: 98.2 F (36.8 C)   Height: 5' 8.75" (174.6 cm)  Weight: 157 lb 3.2 oz (71.3 kg)   Physical Exam  Constitutional: She is well-developed, well-nourished, and in no distress.  Nasal voice  HENT:  Head: Normocephalic.  Right Ear: Tympanic membrane, external ear and ear canal normal.  Left Ear: Tympanic membrane, external ear and ear canal normal.  Nose: Mucosal edema present. No rhinorrhea.  Mouth/Throat: Uvula is midline, oropharynx is clear and moist and mucous membranes are normal. No oropharyngeal exudate.  Eyes: Conjunctivae are normal.  Neck: Trachea normal. No tracheal tenderness present. No tracheal deviation present. No thyromegaly present.    Cardiovascular: Normal rate, regular rhythm, S1 normal, S2 normal and normal heart sounds.   No murmur heard. Pulmonary/Chest: Breath sounds normal. No stridor. No respiratory distress. She has no wheezes. She has no rales.  Musculoskeletal: She exhibits no edema.  Lymphadenopathy:       Head (right side): No tonsillar adenopathy present.       Head (left side): No tonsillar adenopathy present.    She has no cervical adenopathy.  Neurological: She is alert. Gait normal.  Skin: No rash noted. She is not diaphoretic. No erythema. Nails show no clubbing.  Psychiatric: Mood and affect normal.    Diagnostics: Allergy skin testing was performed. She demonstrated hypersensitivity against dust mite, cat, and dog  Assessment and Plan:   1. Other allergic rhinitis     1. Allergen avoidance measures  2. Treat and prevent inflammation:   A. OTC Rhinocort one spray each nostril once a day  B. montelukast 10 mg tablet once a day  C. prednisone 10 mg one tablet once a day for 10 days  3. If needed:   A. OTC antihistamine/decongestant  B. nasal saline spray  4. Consider a course of immunotherapy  5. Return to clinic in 12 weeks or earlier if problem  6. Obtain a flu vaccine  Kerri Durham has very significant hypersensitivity directed against specific aeroallergens giving rise to her very significant upper airway symptoms for which she should definitely consider starting a course immunotherapy as previous therapy administered in the past has been ineffective in taking care of this issue. For today I did give her a nasal steroid and a leukotriene modifier in a very short course of systemic steroids. I will see her back in this clinic in 12 weeks or earlier if there is a problem.  Laurette SchimkeEric Kozlow, MD [facility]

## 2016-10-07 NOTE — Patient Instructions (Addendum)
  1. Allergen avoidance measures  2. Treat and prevent inflammation:   A. OTC Rhinocort one spray each nostril once a day  B. montelukast 10 mg tablet once a day  C. prednisone 10 mg one tablet once a day for 10 days  3. If needed:   A. OTC antihistamine/decongestant  B. nasal saline spray  4. Consider a course of immunotherapy  5. Return to clinic in 12 weeks or earlier if problem  6. Obtain a flu vaccine

## 2016-10-07 NOTE — Progress Notes (Signed)
Vials to be made 10-08-16.  jm

## 2016-10-08 DIAGNOSIS — J3089 Other allergic rhinitis: Secondary | ICD-10-CM | POA: Diagnosis not present

## 2016-10-09 DIAGNOSIS — J301 Allergic rhinitis due to pollen: Secondary | ICD-10-CM | POA: Diagnosis not present

## 2016-12-22 ENCOUNTER — Ambulatory Visit: Payer: BLUE CROSS/BLUE SHIELD | Admitting: Allergy and Immunology

## 2017-01-11 ENCOUNTER — Other Ambulatory Visit: Payer: Self-pay | Admitting: Physician Assistant

## 2017-01-11 DIAGNOSIS — K118 Other diseases of salivary glands: Secondary | ICD-10-CM

## 2017-02-16 ENCOUNTER — Ambulatory Visit
Admission: RE | Admit: 2017-02-16 | Discharge: 2017-02-16 | Disposition: A | Discharge status: Home / Self Care | Payer: 59 | Source: Ambulatory Visit | Attending: Physician Assistant | Admitting: Physician Assistant

## 2017-02-16 DIAGNOSIS — K118 Other diseases of salivary glands: Secondary | ICD-10-CM

## 2017-02-16 MED ORDER — IOPAMIDOL (ISOVUE-300) INJECTION 61%
75.0000 mL | Freq: Once | INTRAVENOUS | Status: AC | PRN
  Administered 2017-02-16: 75 mL via INTRAVENOUS

## 2017-12-09 IMAGING — CT CT NECK W/ CM
2 of 3 series · 7 of 14 positions shown, 8 images · IV contrast (iopamidol)
Comparison: Report from outside brain MRI 09/24/2016 performed at
[REDACTED]

CLINICAL DATA: Right parotid mass.

EXAM:
CT NECK WITH CONTRAST
TECHNIQUE: Multidetector CT imaging of the neck was performed using the
standard protocol following the bolus administration of intravenous
contrast.
CONTRAST:  75mL RIA111-LFF IOPAMIDOL (RIA111-LFF) INJECTION 61%

[Series 2: neck · axial · 0.39mm/px · z∈[-472,-334]mm · 4 of 116 slices shown, 5 images]
[im 24/116  soft-tissue]
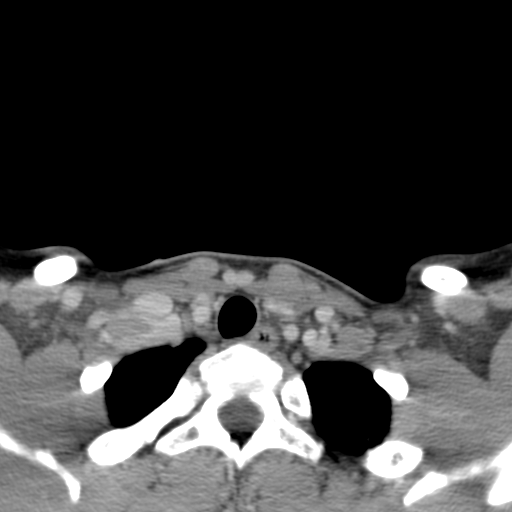
[im 24/116  bone]
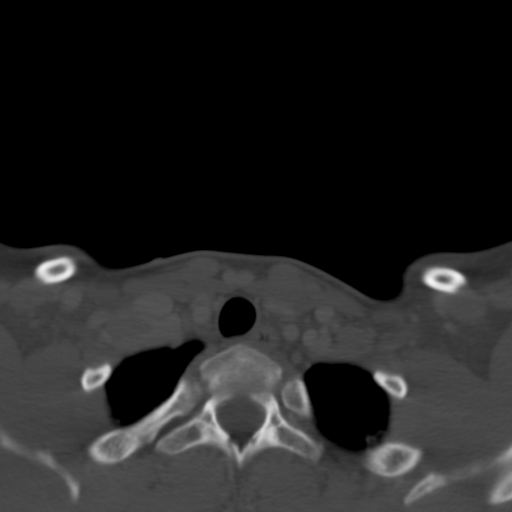
[im 47/116  bone]
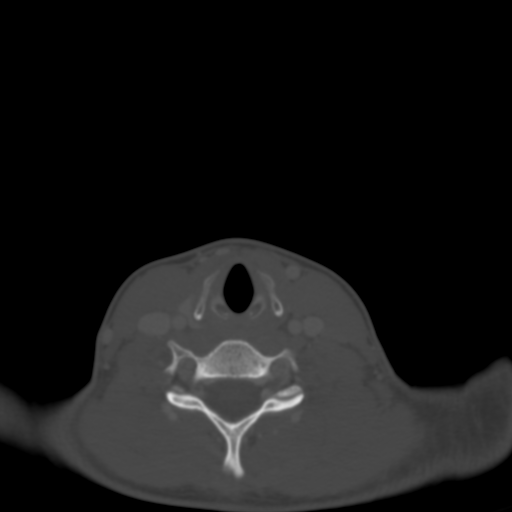
[im 70/116  bone]
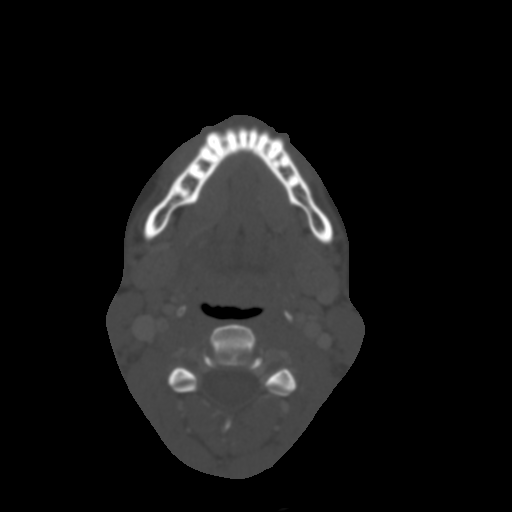
[im 93/116  bone]
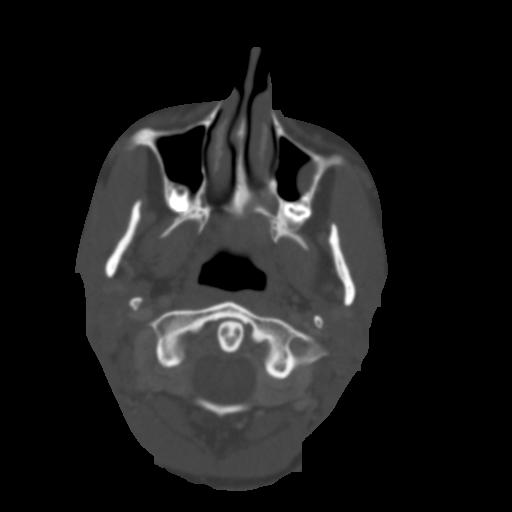

[Series 7: angled axial-oropharynx · axial · 0.39mm/px · z∈[-481,-372]mm · 3 of 114 slices shown]
[im 29/114  bone]
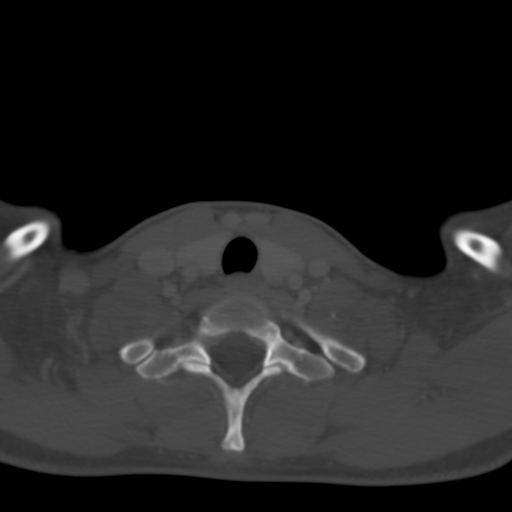
[im 57/114  bone]
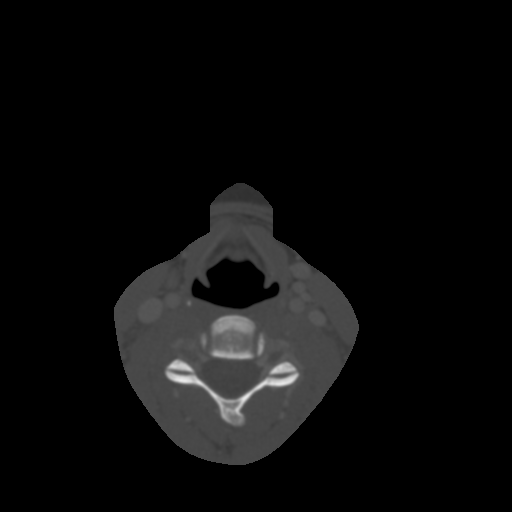
[im 85/114  bone]
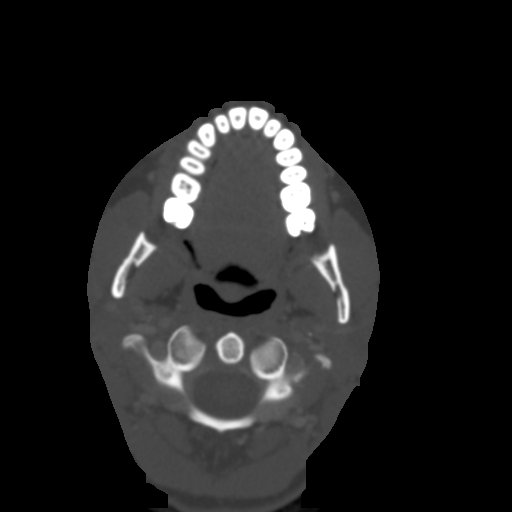

[7 of 14 positions shown; findings below may reference images not displayed]

FINDINGS: Pharynx and larynx: Mild symmetric prominence of the lingual
tonsils. No discrete pharyngeal mass identified. Unremarkable
larynx.

Salivary glands: Submandibular and parotid glands are unremarkable.
Specifically, no discrete right parotid or periparotid mass is
identified.

Thyroid: Unremarkable.

Lymph nodes: There scattered small anterior and posterior cervical
chain lymph nodes bilaterally, most likely benign/ reactive with the
largest being a 9 mm short axis right level II/ III node.

Vascular: Unremarkable.

Limited intracranial: Unremarkable.

Visualized orbits: Unremarkable.

Mastoids and visualized paranasal sinuses: Mild mucosal thickening/
small mucous retention cysts in both maxillary sinuses. Clear
mastoid air cells.

Skeleton: Unremarkable.

Upper chest: Clear lung apices.

Other: None.
IMPRESSION: No parotid/periparotid mass identified.
